# Patient Record
Sex: Male | Born: 1970 | Race: White | Hispanic: No | Marital: Married | Smoking: Former smoker
Health system: Southern US, Community
[De-identification: ages and names within clinical notes are randomized; demographics above are authoritative.]

## PROBLEM LIST (undated history)

## (undated) HISTORY — PX: BACK SURGERY: SHX140

## (undated) HISTORY — PX: OTHER SURGICAL HISTORY: SHX169

---

## 2002-05-22 ENCOUNTER — Ambulatory Visit (HOSPITAL_COMMUNITY): Admission: RE | Admit: 2002-05-22 | Discharge: 2002-05-22 | Payer: Self-pay | Admitting: Orthopaedic Surgery

## 2002-05-22 ENCOUNTER — Encounter: Payer: Self-pay | Admitting: Orthopaedic Surgery

## 2005-08-28 ENCOUNTER — Emergency Department (HOSPITAL_COMMUNITY): Admission: EM | Admit: 2005-08-28 | Discharge: 2005-08-28 | Payer: Self-pay | Admitting: Emergency Medicine

## 2005-09-20 ENCOUNTER — Ambulatory Visit (HOSPITAL_COMMUNITY): Admission: RE | Admit: 2005-09-20 | Discharge: 2005-09-20 | Payer: Self-pay | Admitting: Orthopaedic Surgery

## 2005-11-01 ENCOUNTER — Ambulatory Visit (HOSPITAL_COMMUNITY): Admission: RE | Admit: 2005-11-01 | Discharge: 2005-11-02 | Payer: Self-pay | Admitting: Orthopedic Surgery

## 2005-11-01 ENCOUNTER — Encounter (INDEPENDENT_AMBULATORY_CARE_PROVIDER_SITE_OTHER): Payer: Self-pay | Admitting: *Deleted

## 2006-10-28 IMAGING — CR DG LUMBAR SPINE COMPLETE 4+V
5 series · 5 of 5 positions shown · non-contrast
Comparison: none

HISTORY: Low back pain

LUMBAR SPINE 4 VIEWS:
Five lumbar vertebra.
Mild broad-based levoconvex scoliosis apex L3.
No fracture or subluxation.
Vertebral body and disc space heights maintained.
No spondylolysis or bone destruction.

[t l-spine a.p.]
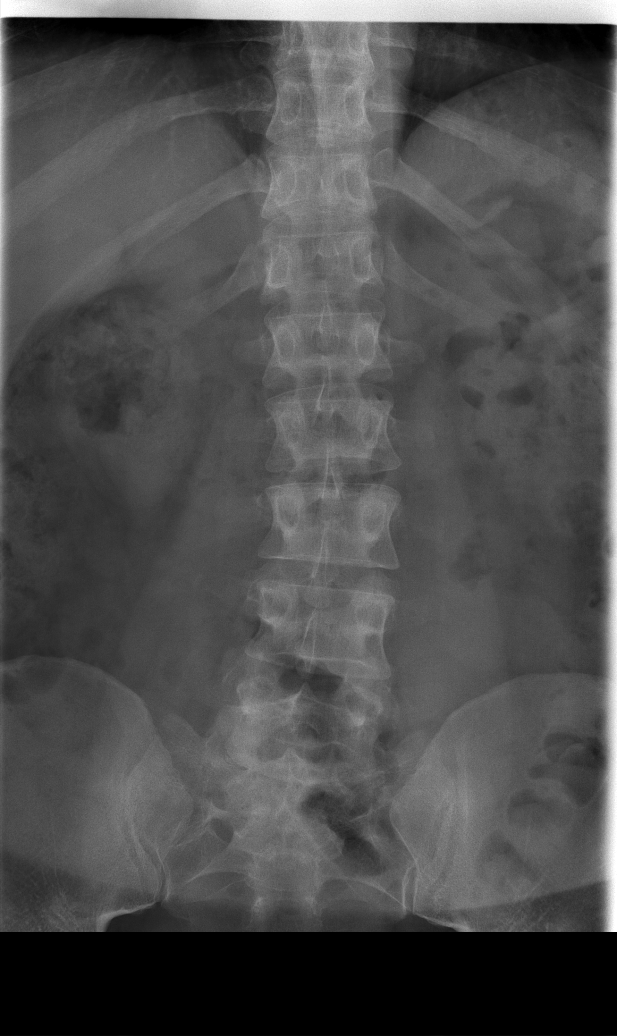

[t l-spine oblique exposure (1 of 2)]
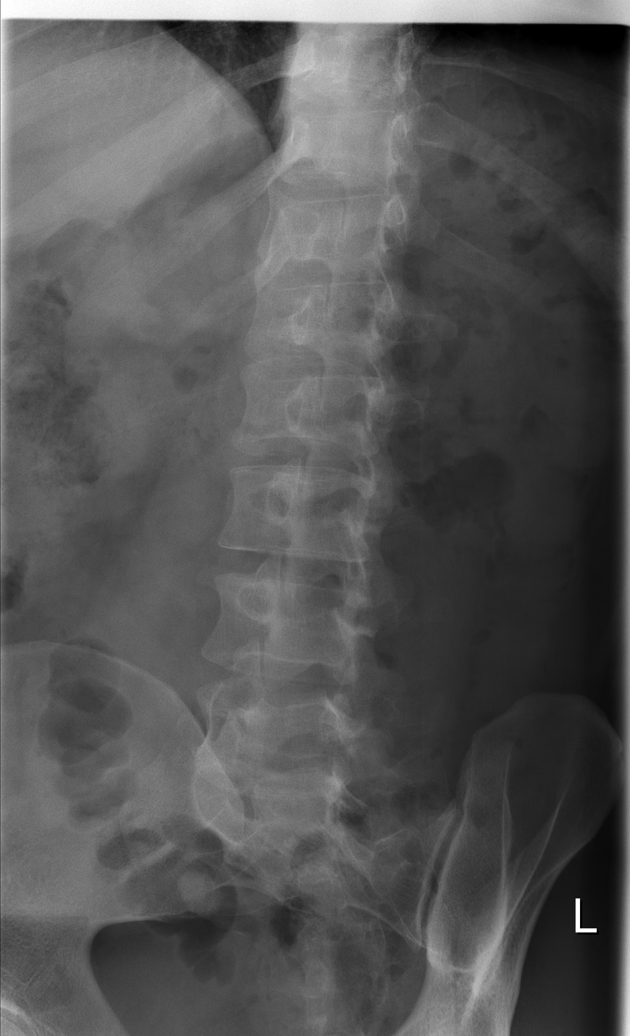

[t l-spine oblique exposure (2 of 2)]
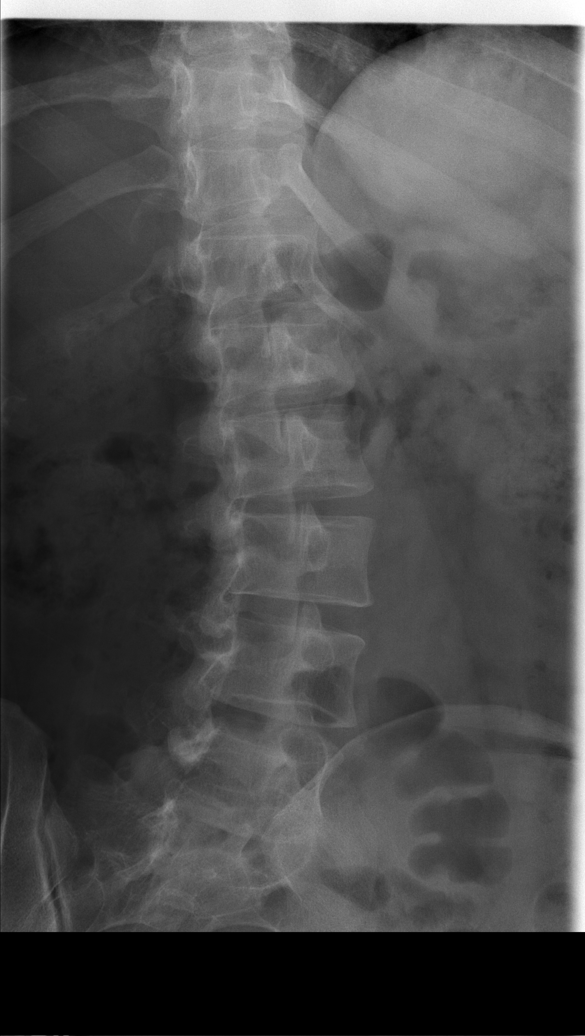

[t l-spine lat]
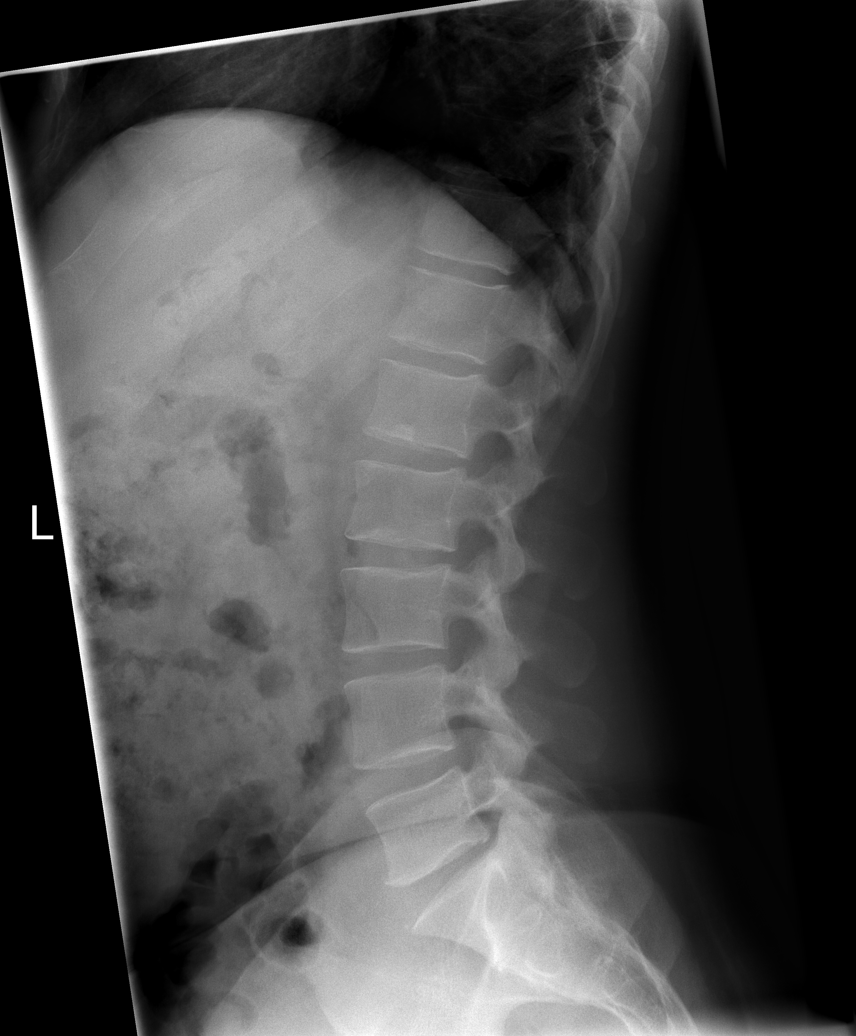

[t l-spine l5-s1 spot]
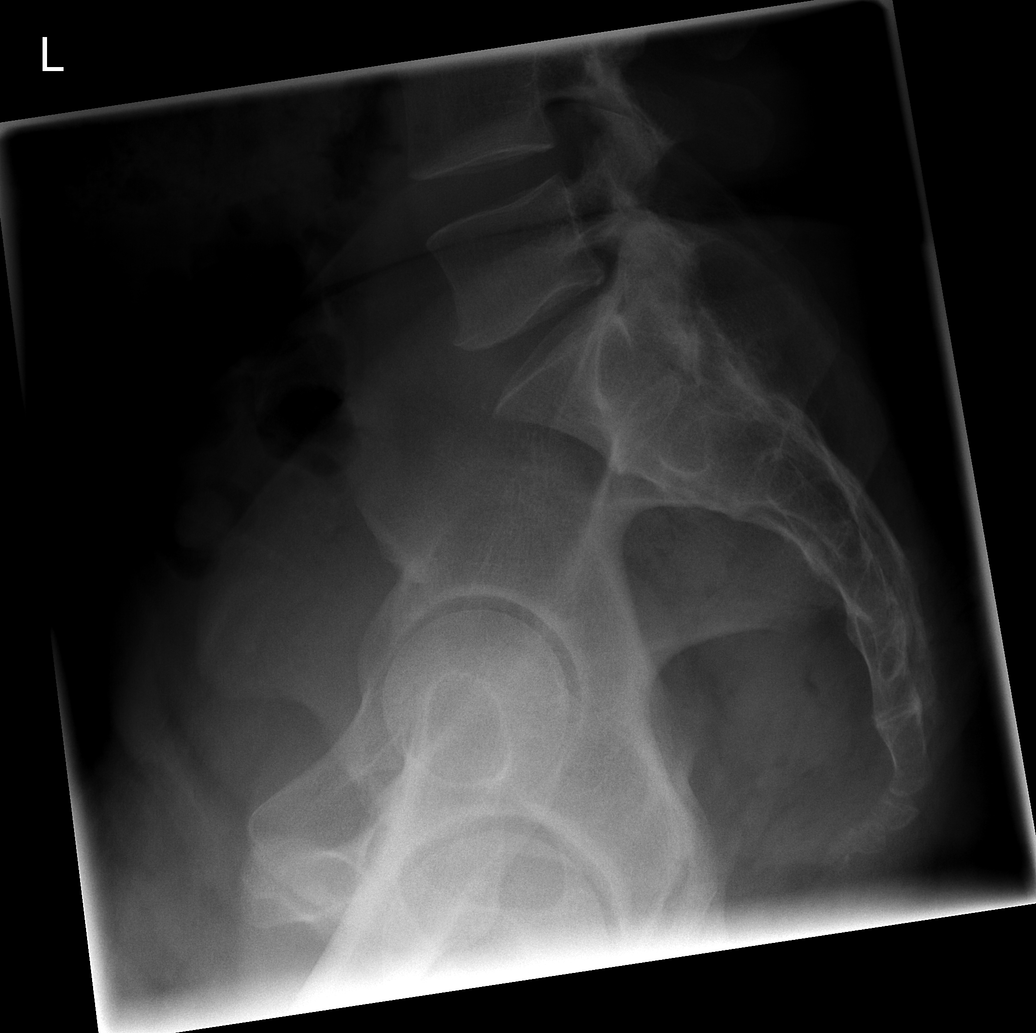

[5 of 5 positions shown; findings below may reference images not displayed]

IMPRESSION: Minimal scoliosis.

## 2009-07-29 ENCOUNTER — Ambulatory Visit: Payer: Self-pay | Admitting: Family Medicine

## 2009-07-29 DIAGNOSIS — R209 Unspecified disturbances of skin sensation: Secondary | ICD-10-CM | POA: Insufficient documentation

## 2009-07-29 DIAGNOSIS — M7710 Lateral epicondylitis, unspecified elbow: Secondary | ICD-10-CM | POA: Insufficient documentation

## 2009-08-11 ENCOUNTER — Encounter: Admission: RE | Admit: 2009-08-11 | Discharge: 2009-09-05 | Payer: Self-pay | Admitting: Family Medicine

## 2009-08-26 ENCOUNTER — Encounter: Payer: Self-pay | Admitting: Family Medicine

## 2009-09-24 ENCOUNTER — Encounter: Payer: Self-pay | Admitting: Family Medicine

## 2010-10-10 NOTE — Letter (Signed)
Summary: PHYSICAL THERAPY NOTE  PHYSICAL THERAPY NOTE   Imported By: Junius Finner 09/24/2009 11:40:27  _____________________________________________________________________  External Attachment:    Type:   Image     Comment:   External Document

## 2010-10-10 NOTE — Letter (Signed)
Summary: PHYSICAL THERAPY DISCHARGE SUMMARY  PHYSICAL THERAPY DISCHARGE SUMMARY   Imported By: Junius Finner 09/24/2009 11:39:08  _____________________________________________________________________  External Attachment:    Type:   Image     Comment:   External Document

## 2011-01-26 NOTE — Op Note (Signed)
NAME:  Michael Anthony, Michael Anthony                ACCOUNT NO.:  [id number]   MEDICAL RECORD NO.:  [id number]          PATIENT TYPE:  OIB   LOCATION:  1509                         FACILITY:  [facility]   PHYSICIAN:  Marlowe Kays, M.D.  DATE OF BIRTH:  [birth year]   DATE OF PROCEDURE:  11/01/2005  DATE OF DISCHARGE:  11/02/2005                                 OPERATIVE REPORT   PREOPERATIVE DIAGNOSIS:  Recurrent herniated nucleus pulposus, L5-S1, right.   POSTOPERATIVE DIAGNOSIS:  Recurrent herniated nucleus pulposus, L5-S1,  right.   OPERATION:  Microdiskectomy, L5-S1, right, with excision of recurrent  herniated nucleus pulposus.   SURGEON:  Marlowe Kays, M.D.   ASSISTANT:  Jene Every, M.D.   ANESTHESIA:  General.   PATHOLOGY AND JUSTIFICATION FOR PROCEDURE:  I originally performed his  surgery over 10 years ago.  Recently, he has had recurrence of right leg  pain with numbness in his little and fourth toes with an MRI showing a  recurrent disk herniation; accordingly, he is here today for the above-  mentioned surgery.  Risks and complications were discussed with him.   PROCEDURE:  Prophylactic antibiotic followed by general anesthesia, knee-  chest position on the Chisholm frame.  Back was prepped with DuraPrep, draped  into a sterile field, Ioban employed.  Old surgical scar was excised.  I  dissected soft tissue off the portions of the lamina of what appeared to be  L5 and S1, which I tagged with Kocher clamps, took a lateral x-ray,  confirming that we were at L5-S1, with the interspace midway between.  On  further dissection, it was apparent that this was the previous level of the  laminectomy.  I isolated the bony perimeter with small curette of the sacrum  and L5.  I was able to enter in above the sacrum with a 2-mm Kerrison  rongeur after curetting a small perforation there and I then began removing  bone, working around the perimeter, laterally and then superiorly, until we  had  some adequate working room.  We then brought in the microscope and  completed the perimeter dissection.  There was a synovial cyst at L5-S1 from  the facet joint, which we excised.  The S1 nerve root was identified and  retracted medially and freed up.  We kept dissecting cephalad until we found  the pedicle and the disk just above, which I identified with a spinal  needle.  I entered the interspace with a Penfield 4 instrument, enlarged  with Epstein curette and then began working with first a micro and then  small regular pituitaries and upbiting pituitaries, removing a large amount  of disk material, all that was available in the interspace.  When we felt  like the decompression had been completed, we checked and there was  excellent mobility of S1 nerve root and the dura.  The L5-S1 foramen was  widely patent.  Two potential bleeders were coagulated with bipolar cautery.  The wound was dry on closure.  I irrigated it well with sterile saline and  we then placed Gelfoam over the interspace, around the  S1 nerve root and  dura.  The self-retaining retractors were removed and again, there was no  unusual bleeding.  He was given 30 mg of Toradol IV.  I closed the fascia  with interrupted #1 Vicryl as well as the deep subcutaneous tissue, which I  then infiltrated with 0.5% plain Marcaine.  I completed the closure with 2-0  Vicryl in  the superficial subcutaneous tissue and staples in the skin.  Betadine,  Adaptic and dry sterile dressing were applied.  He tolerated the procedure  well and was taken to the recovery room in satisfactory condition with no  known complications.           ______________________________  Marlowe Kays, M.D.     JA/MEDQ  D:  11/01/2005  T:  11/02/2005  Job:  [id number]

## 2017-10-14 ENCOUNTER — Emergency Department (INDEPENDENT_AMBULATORY_CARE_PROVIDER_SITE_OTHER)
Admission: EM | Admit: 2017-10-14 | Discharge: 2017-10-14 | Disposition: A | Discharge status: Home / Self Care | Payer: Commercial Managed Care - PPO | Source: Home / Self Care | Attending: Family Medicine | Admitting: Family Medicine

## 2017-10-14 ENCOUNTER — Encounter: Payer: Self-pay | Admitting: Emergency Medicine

## 2017-10-14 DIAGNOSIS — J3489 Other specified disorders of nose and nasal sinuses: Secondary | ICD-10-CM

## 2017-10-14 DIAGNOSIS — J029 Acute pharyngitis, unspecified: Secondary | ICD-10-CM | POA: Diagnosis not present

## 2017-10-14 DIAGNOSIS — J111 Influenza due to unidentified influenza virus with other respiratory manifestations: Secondary | ICD-10-CM

## 2017-10-14 DIAGNOSIS — R51 Headache: Secondary | ICD-10-CM | POA: Diagnosis not present

## 2017-10-14 DIAGNOSIS — R0981 Nasal congestion: Secondary | ICD-10-CM | POA: Diagnosis not present

## 2017-10-14 DIAGNOSIS — R69 Illness, unspecified: Principal | ICD-10-CM

## 2017-10-14 DIAGNOSIS — R05 Cough: Secondary | ICD-10-CM | POA: Diagnosis not present

## 2017-10-14 DIAGNOSIS — Z20828 Contact with and (suspected) exposure to other viral communicable diseases: Secondary | ICD-10-CM

## 2017-10-14 LAB — POCT RAPID STREP A (OFFICE): Rapid Strep A Screen: NEGATIVE

## 2017-10-14 MED ORDER — ONDANSETRON HCL 4 MG PO TABS: 4.0000 mg | ORAL_TABLET | Freq: Four times a day (QID) | ORAL | 0 refills | Status: DC

## 2017-10-14 MED ORDER — BENZONATATE 100 MG PO CAPS: 100.0000 mg | ORAL_CAPSULE | Freq: Three times a day (TID) | ORAL | 0 refills | Status: DC

## 2017-10-14 MED ORDER — OSELTAMIVIR PHOSPHATE 75 MG PO CAPS: 75.0000 mg | ORAL_CAPSULE | Freq: Two times a day (BID) | ORAL | 0 refills | Status: DC

## 2017-10-14 NOTE — ED Triage Notes (Signed)
Pt states Thursday he developed fever and body aches. Denies fever since then but still having cough and HA. States co workers have been dx with flu A.

## 2017-10-14 NOTE — Discharge Instructions (Signed)
°  You may take 500mg  acetaminophen every 4-6 hours or in combination with ibuprofen 400-600mg  every 6-8 hours as needed for pain, inflammation, and fever.  Be sure to drink at least eight 8oz glasses of water to stay well hydrated and get at least 8 hours of sleep at night, preferably more while sick.   Oseltamivir (Tamiflu) works best if started within the first 48 hours of symptom onset but you may still get some benefit from taking ,especially if others around you still have the flu.  This medication may cause stomach upset including nausea, vomiting and diarrhea.  To help prevent stomach upset, you may take this medication with food.  If you are still having unwanted symptoms, you may stop taking this medication as it is not as important to finish the entire course like antibiotics.  If you have questions/concerns please call our office or follow up with your primary care provider.

## 2017-10-14 NOTE — ED Provider Notes (Signed)
[facility]    CSN: [id number] Arrival date & time: 10/14/17  1045     History   Chief Complaint Chief Complaint  Patient presents with  . Fever    HPI Michael Anthony is a 47 y.o. male.   HPI Michael Anthony is a 47 y.o. male presenting to UC with c/o 2 days of fever of 101*F with body aches that started 4 days ago.  Fever has resolved but he has had continued cough, sore throat and generalized HA.  He has taken Dayquil and Nyquil with moderate relief. Several co-workers have been dx with influenza A.  He did not receive the flu vaccine this season. Mild nausea but no vomiting or diarrhea. Denies chest pain or SOB.   History reviewed. No pertinent past medical history.  Patient Active Problem List   Diagnosis Date Noted  . LATERAL EPICONDYLITIS, RIGHT 07/29/2009  . PARESTHESIA, HANDS 07/29/2009    History reviewed. No pertinent surgical history.     Home Medications    Prior to Admission medications   Medication Sig Start Date End Date Taking? Authorizing Provider  benzonatate (TESSALON) 100 MG capsule Take 1-2 capsules (100-200 mg total) by mouth every 8 (eight) hours. 10/14/17   Lurene Shadow, PA-C  ondansetron (ZOFRAN) 4 MG tablet Take 1 tablet (4 mg total) by mouth every 6 (six) hours. 10/14/17   Lurene Shadow, PA-C  oseltamivir (TAMIFLU) 75 MG capsule Take 1 capsule (75 mg total) by mouth every 12 (twelve) hours. 10/14/17   Lurene Shadow, PA-C    Family History History reviewed. No pertinent family history.  Social History Social History   Tobacco Use  . Smoking status: Current Every Day Smoker    Types: Cigarettes, E-cigarettes  . Smokeless tobacco: Never Used  Substance Use Topics  . Alcohol use: Yes  . Drug use: Not on file     Allergies   Sulfonamide derivatives and Penicillins   Review of Systems Review of Systems  Constitutional: Negative for chills and fever.  HENT: Positive for congestion, rhinorrhea and sore throat. Negative for  ear pain, trouble swallowing and voice change.   Respiratory: Positive for cough. Negative for shortness of breath.   Cardiovascular: Negative for chest pain and palpitations.  Gastrointestinal: Negative for abdominal pain, diarrhea, nausea and vomiting.  Musculoskeletal: Negative for arthralgias, back pain and myalgias.  Skin: Negative for rash.  Neurological: Positive for headaches. Negative for dizziness and light-headedness.     Physical Exam Triage Vital Signs ED Triage Vitals [10/14/17 1126]  Enc Vitals Group     BP 112/78     Pulse Rate 76     Resp      Temp 98 F (36.7 C)     Temp Source Oral     SpO2 97 %     Weight 194 lb (88 kg)     Height      Head Circumference      Peak Flow      Pain Score 0     Pain Loc      Pain Edu?      Excl. in GC?    No data found.  Updated Vital Signs BP 112/78 (BP Location: Right Arm)   Pulse 76   Temp 98 F (36.7 C) (Oral)   Wt 194 lb (88 kg)   SpO2 97%   BMI 32.28 kg/m   Visual Acuity Right Eye Distance:   Left Eye Distance:   Bilateral Distance:  Right Eye Near:   Left Eye Near:    Bilateral Near:     Physical Exam  Constitutional: He is oriented to person, place, and time. He appears well-developed and well-nourished. No distress.  HENT:  Head: Normocephalic and atraumatic.  Right Ear: Tympanic membrane normal.  Left Ear: Tympanic membrane normal.  Nose: Right sinus exhibits no maxillary sinus tenderness and no frontal sinus tenderness. Left sinus exhibits no maxillary sinus tenderness and no frontal sinus tenderness.  Mouth/Throat: Uvula is midline and mucous membranes are normal. Posterior oropharyngeal erythema present. No oropharyngeal exudate, posterior oropharyngeal edema or tonsillar abscesses.  Eyes: EOM are normal.  Neck: Normal range of motion. Neck supple.  Cardiovascular: Normal rate and regular rhythm.  Pulmonary/Chest: Effort normal and breath sounds normal. No stridor. No respiratory distress.  He has no wheezes. He has no rales.  Musculoskeletal: Normal range of motion.  Lymphadenopathy:    He has no cervical adenopathy.  Neurological: He is alert and oriented to person, place, and time.  Skin: Skin is warm and dry. He is not diaphoretic.  Psychiatric: He has a normal mood and affect. His behavior is normal.  Nursing note and vitals reviewed.    UC Treatments / Results  Labs (all labs ordered are listed, but only abnormal results are displayed) Labs Reviewed  POCT RAPID STREP A (OFFICE)    EKG  EKG Interpretation None       Radiology No results found.  Procedures Procedures (including critical care time)  Medications Ordered in UC Medications - No data to display   Initial Impression / Assessment and Plan / UC Course  I have reviewed the triage vital signs and the nursing notes.  Pertinent labs & imaging results that were available during my care of the patient were reviewed by me and considered in my medical decision making (see chart for details).     Strep: NEGATIVE  Advised pt he likely has/had the flu due to not getting the flu vaccine as well as known exposure at work. Advised pt Tamiflu is recommended to be started within first 48 hours of symptom onset. Pt still wants to try Tamiflu Medication sent to pharmacy Home care instructions provided F/u with PCP in 1 week if needed.   Final Clinical Impressions(s) / UC Diagnoses   Final diagnoses:  Influenza-like illness  Exposure to the flu  Acute pharyngitis, unspecified etiology    ED Discharge Orders        Ordered    oseltamivir (TAMIFLU) 75 MG capsule  Every 12 hours     10/14/17 1203    benzonatate (TESSALON) 100 MG capsule  Every 8 hours     10/14/17 1203    ondansetron (ZOFRAN) 4 MG tablet  Every 6 hours     10/14/17 1204       Controlled Substance Prescriptions [place] Controlled Substance Registry consulted? Not Applicable   Rolla Platehelps, Jaedan Huttner O, PA-C 10/14/17 1238

## 2021-03-25 ENCOUNTER — Emergency Department (INDEPENDENT_AMBULATORY_CARE_PROVIDER_SITE_OTHER)
Admission: EM | Admit: 2021-03-25 | Discharge: 2021-03-25 | Disposition: A | Discharge status: Home / Self Care | Payer: Managed Care, Other (non HMO) | Source: Home / Self Care

## 2021-03-25 ENCOUNTER — Other Ambulatory Visit: Payer: Self-pay

## 2021-03-25 DIAGNOSIS — K029 Dental caries, unspecified: Secondary | ICD-10-CM

## 2021-03-25 MED ORDER — CLINDAMYCIN HCL 300 MG PO CAPS: 300.0000 mg | ORAL_CAPSULE | Freq: Three times a day (TID) | ORAL | 0 refills | Status: AC

## 2021-03-25 NOTE — ED Provider Notes (Signed)
[facility]    CSN: [id number] Arrival date & time: 03/25/21  1522      History   Chief Complaint Chief Complaint  Patient presents with   Dental Problem    HPI Michael Anthony is a 50 y.o. male.   HPI 50 year old male presents with dental pain and swelling of right lower tooth.  Reports that he is supposed to have it pulled and 1 week; however, pain and swelling adjacent to tooth has increased over the past day.  History reviewed. No pertinent past medical history.  Patient Active Problem List   Diagnosis Date Noted   LATERAL EPICONDYLITIS, RIGHT 07/29/2009   PARESTHESIA, HANDS 07/29/2009    Past Surgical History:  Procedure Laterality Date   BACK SURGERY         Home Medications    Prior to Admission medications   Medication Sig Start Date End Date Taking? Authorizing Provider  clindamycin (CLEOCIN) 300 MG capsule Take 1 capsule (300 mg total) by mouth 3 (three) times daily for 7 days. 03/25/21 04/01/21 Yes Trevor Iha, FNP  naproxen sodium (ALEVE) 220 MG tablet Take 220 mg by mouth 2 (two) times daily as needed.   Yes [provider]  benzonatate (TESSALON) 100 MG capsule Take 1-2 capsules (100-200 mg total) by mouth every 8 (eight) hours. 10/14/17   Lurene Shadow, PA-C  ondansetron (ZOFRAN) 4 MG tablet Take 1 tablet (4 mg total) by mouth every 6 (six) hours. 10/14/17   Lurene Shadow, PA-C  oseltamivir (TAMIFLU) 75 MG capsule Take 1 capsule (75 mg total) by mouth every 12 (twelve) hours. 10/14/17   Lurene Shadow, PA-C    Family History Family History  Problem Relation Age of Onset   COPD Mother    Cancer Father     Social History Social History   Tobacco Use   Smoking status: Former    Types: Cigarettes    Quit date: 09/11/2017    Years since quitting: 3.5   Smokeless tobacco: Never  Vaping Use   Vaping Use: Every day  Substance Use Topics   Alcohol use: Yes    Comment: occasionally     Allergies   Ibuprofen,  Sulfonamide derivatives, and Penicillins   Review of Systems Review of Systems  HENT:  Positive for dental problem.   All other systems reviewed and are negative.   Physical Exam Triage Vital Signs ED Triage Vitals  Enc Vitals Group     BP 03/25/21 1544 111/77     Pulse Rate 03/25/21 1544 72     Resp 03/25/21 1544 18     Temp 03/25/21 1544 99.1 F (37.3 C)     Temp src --      SpO2 03/25/21 1544 98 %     Weight 03/25/21 1536 190 lb (86.2 kg)     Height 03/25/21 1536 5\' 8"  (1.727 m)     Head Circumference --      Peak Flow --      Pain Score 03/25/21 1535 3     Pain Loc --      Pain Edu? --      Excl. in GC? --    No data found.  Updated Vital Signs BP 111/77   Pulse 72   Temp 99.1 F (37.3 C)   Resp 18   Ht 5\' 8"  (1.727 m)   Wt 190 lb (86.2 kg)   SpO2 98%   BMI 28.89 kg/m  Physical Exam Vitals and nursing note reviewed.  Constitutional:      General: He is not in acute distress.    Appearance: Normal appearance. He is normal weight. He is not ill-appearing.  HENT:     Head: Normocephalic and atraumatic.     Mouth/Throat:     Mouth: Mucous membranes are moist.     Pharynx: Oropharynx is clear.     Comments: Left lower: central incisors, canine, second premolar-significant dental caries noted with a moderate erythematous medial and lateral gingival borders. Eyes:     Extraocular Movements: Extraocular movements intact.     Conjunctiva/sclera: Conjunctivae normal.     Pupils: Pupils are equal, round, and reactive to light.  Cardiovascular:     Rate and Rhythm: Normal rate and regular rhythm.     Pulses: Normal pulses.     Heart sounds: Normal heart sounds.  Pulmonary:     Effort: Pulmonary effort is normal.     Breath sounds: Normal breath sounds.     Comments: No adventitious breath sounds noted Musculoskeletal:        General: Normal range of motion.     Cervical back: Normal range of motion and neck supple.  Skin:    General: Skin is warm and  dry.  Neurological:     General: No focal deficit present.     Mental Status: He is alert and oriented to person, place, and time.  Psychiatric:        Mood and Affect: Mood normal.        Behavior: Behavior normal.     UC Treatments / Results  Labs (all labs ordered are listed, but only abnormal results are displayed) Labs Reviewed - No data to display  EKG   Radiology No results found.  Procedures Procedures (including critical care time)  Medications Ordered in UC Medications - No data to display  Initial Impression / Assessment and Plan / UC Course  I have reviewed the triage vital signs and the nursing notes.  Pertinent labs & imaging results that were available during my care of the patient were reviewed by me and considered in my medical decision making (see chart for details).    MDM: 1.  Pain due to dental caries-Rx'd Clindamycin, advised/instructed patient to follow-up with his dentist as soon as possible next week.  Patient discharged home, hemodynamically stable Final Clinical Impressions(s) / UC Diagnoses   Final diagnoses:  Pain due to dental caries     Discharge Instructions      Advised/instructed patient to take medication as directed with food to completion.  Advised/instructed patient to follow-up with dentist for immediate evaluation as soon as possible.     ED Prescriptions     Medication Sig Dispense Auth. Provider   clindamycin (CLEOCIN) 300 MG capsule Take 1 capsule (300 mg total) by mouth 3 (three) times daily for 7 days. 21 capsule Trevor Iha, FNP      PDMP not reviewed this encounter.   Trevor Iha, FNP 03/25/21 1615

## 2021-03-25 NOTE — Discharge Instructions (Addendum)
Advised/instructed patient to take medication as directed with food to completion.  Advised/instructed patient to follow-up with dentist for immediate evaluation as soon as possible.

## 2021-03-25 NOTE — ED Triage Notes (Signed)
Pt presents to Urgent Care with c/o dental pain and swelling--R lower tooth. States he is supposed to have it pulled in one week, but the pain and swelling has intensified over the past day.

## 2021-09-26 ENCOUNTER — Emergency Department (INDEPENDENT_AMBULATORY_CARE_PROVIDER_SITE_OTHER)
Admission: RE | Admit: 2021-09-26 | Discharge: 2021-09-26 | Disposition: A | Discharge status: Home / Self Care | Payer: BC Managed Care – PPO | Source: Ambulatory Visit | Attending: Family Medicine | Admitting: Family Medicine

## 2021-09-26 ENCOUNTER — Other Ambulatory Visit: Payer: Self-pay

## 2021-09-26 VITALS — BP 141/83 | HR 69 | Temp 98.6°F | Resp 16 | Ht 68.0 in | Wt 195.0 lb

## 2021-09-26 DIAGNOSIS — U071 COVID-19: Secondary | ICD-10-CM

## 2021-09-26 DIAGNOSIS — J029 Acute pharyngitis, unspecified: Secondary | ICD-10-CM

## 2021-09-26 LAB — POC SARS CORONAVIRUS 2 AG -  ED: SARS Coronavirus 2 Ag: POSITIVE — AB

## 2021-09-26 LAB — POCT RAPID STREP A (OFFICE): Rapid Strep A Screen: NEGATIVE

## 2021-09-26 MED ORDER — PAXLOVID (300/100) 20 X 150 MG & 10 X 100MG PO TBPK: ORAL_TABLET | ORAL | 0 refills | Status: AC

## 2021-09-26 NOTE — ED Triage Notes (Signed)
Sore throat, cough,  runny nose & fever since Sunday morning Temp 101 at home on Sunday  OTC meds - nyquil & dayquil ( last dose at 1345) Son was sick with same symptoms 1 week ago  COVID positive 08/25/20

## 2021-09-26 NOTE — Discharge Instructions (Addendum)
Drink lots of fluids Take over-the-counter medicine for cold and flu symptoms Take paxlovid 2 times a day for 5 days You must quarantine for 5 days at home After this wear mask to prevent transmission

## 2021-09-26 NOTE — ED Provider Notes (Signed)
[name]    CSN: [id number] Arrival date & time: 09/26/21  1651      History   Chief Complaint Chief Complaint  Patient presents with   Nasal Congestion   Fever    HPI Michael Anthony is a 51 y.o. male.   HPI  Patient states has had nasal congestion fever and sore throat for the last couple of days.  His son was sick last week.  He states his throat is pretty painful.  He is worried about strep throat.  He also has had fever.  No headache or body aches.  No fatigue.  No known exposure to illness  History reviewed. No pertinent past medical history.  Patient Active Problem List   Diagnosis Date Noted   LATERAL EPICONDYLITIS, RIGHT 07/29/2009   PARESTHESIA, HANDS 07/29/2009    Past Surgical History:  Procedure Laterality Date   BACK SURGERY         Home Medications    Prior to Admission medications   Medication Sig Start Date End Date Taking? Authorizing Provider  nirmatrelvir & ritonavir (PAXLOVID, 300/100,) 20 x 150 MG & 10 x 100MG  TBPK Take as directed 2 x a day 09/26/21  Yes 09/28/21, MD  naproxen sodium (ALEVE) 220 MG tablet Take 220 mg by mouth 2 (two) times daily as needed.    [provider]    Family History Family History  Problem Relation Age of Onset   COPD Mother    Cancer Father     Social History Social History   Tobacco Use   Smoking status: Former    Types: Cigarettes, E-cigarettes    Quit date: 09/11/2017    Years since quitting: 4.0   Smokeless tobacco: Never  Vaping Use   Vaping Use: Every day   Substances: Nicotine, Flavoring  Substance Use Topics   Alcohol use: Yes    Comment: occasionally     Allergies   Penicillins, Ibuprofen, and Sulfonamide derivatives   Review of Systems Review of Systems See HPI  Physical Exam Triage Vital Signs ED Triage Vitals  Enc Vitals Group     BP 09/26/21 1659 (!) 141/83     Pulse Rate 09/26/21 1659 69     Resp 09/26/21 1659 16     Temp 09/26/21 1659  98.6 F (37 C)     Temp Source 09/26/21 1659 Oral     SpO2 09/26/21 1659 98 %     Weight 09/26/21 1704 195 lb (88.5 kg)     Height 09/26/21 1704 5\' 8"  (1.727 m)     Head Circumference --      Peak Flow --      Pain Score 09/26/21 1703 4     Pain Loc --      Pain Edu? --      Excl. in GC? --    No data found.  Updated Vital Signs BP (!) 141/83 (BP Location: Left Arm)    Pulse 69    Temp 98.6 F (37 C) (Oral)    Resp 16    Ht 5\' 8"  (1.727 m)    Wt 88.5 kg    SpO2 98%    BMI 29.65 kg/m      Physical Exam Constitutional:      General: He is not in acute distress.    Appearance: Normal appearance. He is well-developed.  HENT:     Head: Normocephalic and atraumatic.     Mouth/Throat:     Comments:  Mask is in place Eyes:     Conjunctiva/sclera: Conjunctivae normal.     Pupils: Pupils are equal, round, and reactive to light.  Cardiovascular:     Rate and Rhythm: Normal rate.  Pulmonary:     Effort: Pulmonary effort is normal. No respiratory distress.  Abdominal:     General: There is no distension.     Palpations: Abdomen is soft.  Musculoskeletal:        General: Normal range of motion.     Cervical back: Normal range of motion.  Skin:    General: Skin is warm and dry.  Neurological:     Mental Status: He is alert.  Psychiatric:        Mood and Affect: Mood normal.        Behavior: Behavior normal.     UC Treatments / Results  Labs (all labs ordered are listed, but only abnormal results are displayed) Labs Reviewed  POC SARS CORONAVIRUS 2 AG -  ED - Abnormal; Notable for the following components:      Result Value   SARS Coronavirus 2 Ag Positive (*)    All other components within normal limits  CULTURE, GROUP A STREP  POCT RAPID STREP A (OFFICE)    EKG   Radiology No results found.  Procedures Procedures (including critical care time)  Medications Ordered in UC Medications - No data to display  Initial Impression / Assessment and Plan / UC Course   I have reviewed the triage vital signs and the nursing notes.  Pertinent labs & imaging results that were available during my care of the patient were reviewed by me and considered in my medical decision making (see chart for details).     Patient has COVID.  I discussed the antiviral therapy.  He is only 50 and does not have any major medical problems, he has mild symptoms so I do not guarantee that we will give him much improvement but he does desire paxlovid therapy.  I reviewed his chart and found his last creatinine 1.93.  He does not have any medical problems or medications that would reduce his creatinine. Final Clinical Impressions(s) / UC Diagnoses   Final diagnoses:  Acute pharyngitis, unspecified etiology  COVID-19     Discharge Instructions      Drink lots of fluids Take over-the-counter medicine for cold and flu symptoms Take paxlovid 2 times a day for 5 days You must quarantine for 5 days at home After this wear mask to prevent transmission   ED Prescriptions     Medication Sig Dispense Auth. Provider   nirmatrelvir & ritonavir (PAXLOVID, 300/100,) 20 x 150 MG & 10 x 100MG  TBPK Take as directed 2 x a day 1 each , MD      PDMP not reviewed this encounter.   Eustace Moore, MD 09/26/21 [phone]

## 2021-09-29 LAB — CULTURE, GROUP A STREP: Strep A Culture: NEGATIVE

## 2022-06-25 ENCOUNTER — Ambulatory Visit
Admission: EM | Admit: 2022-06-25 | Discharge: 2022-06-25 | Disposition: A | Discharge status: Home / Self Care | Payer: BC Managed Care – PPO | Attending: Family Medicine | Admitting: Family Medicine

## 2022-06-25 DIAGNOSIS — H6692 Otitis media, unspecified, left ear: Secondary | ICD-10-CM | POA: Diagnosis not present

## 2022-06-25 MED ORDER — CEFDINIR 300 MG PO CAPS: 300.0000 mg | ORAL_CAPSULE | Freq: Two times a day (BID) | ORAL | 0 refills | Status: AC

## 2022-06-25 NOTE — ED Provider Notes (Signed)
[facility]    CSN: [id number] Arrival date & time: 06/25/22  1022      History   Chief Complaint Chief Complaint  Patient presents with   Ear Fullness   Otalgia   Hearing Problem    HPI Michael Anthony is a 51 y.o. male.   HPI Pleasant 51 year old male presents with left ear fullness for 1.5 weeks.  Reports using OTC eardrops as needed due to history of wax buildup.  Reports hearing difficulty and ear pain when using this OTC drop.  PMH significant for obesity and paresthesia of the hands.  History reviewed. No pertinent past medical history.  Patient Active Problem List   Diagnosis Date Noted   LATERAL EPICONDYLITIS, RIGHT 07/29/2009   PARESTHESIA, HANDS 07/29/2009    Past Surgical History:  Procedure Laterality Date   BACK SURGERY         Home Medications    Prior to Admission medications   Medication Sig Start Date End Date Taking? Authorizing Provider  cefdinir (OMNICEF) 300 MG capsule Take 1 capsule (300 mg total) by mouth 2 (two) times daily for 10 days. 06/25/22 07/05/22 Yes Trevor Iha, FNP  naproxen sodium (ALEVE) 220 MG tablet Take 220 mg by mouth 2 (two) times daily as needed.    [provider]  nirmatrelvir & ritonavir (PAXLOVID, 300/100,) 20 x 150 MG & 10 x 100MG  TBPK Take as directed 2 x a day 09/26/21   09/28/21, MD    Family History Family History  Problem Relation Age of Onset   COPD Mother    Cancer Father     Social History Social History   Tobacco Use   Smoking status: Former    Types: Cigarettes    Quit date: 09/11/2017    Years since quitting: 4.7   Smokeless tobacco: Never  Vaping Use   Vaping Use: Every day   Substances: Nicotine, Flavoring  Substance Use Topics   Alcohol use: Yes    Comment: occasionally   Drug use: Not Currently     Allergies   Penicillins, Ibuprofen, and Sulfonamide derivatives   Review of Systems Review of Systems  HENT:  Positive for congestion and ear pain.    All other systems reviewed and are negative.    Physical Exam Triage Vital Signs ED Triage Vitals  Enc Vitals Group     BP 06/25/22 1120 108/74     Pulse Rate 06/25/22 1120 63     Resp 06/25/22 1120 20     Temp 06/25/22 1120 97.9 F (36.6 C)     Temp Source 06/25/22 1120 Oral     SpO2 06/25/22 1120 97 %     Weight 06/25/22 1118 195 lb (88.5 kg)     Height 06/25/22 1118 5\' 8"  (1.727 m)     Head Circumference --      Peak Flow --      Pain Score 06/25/22 1117 2     Pain Loc --      Pain Edu? --      Excl. in GC? --    No data found.  Updated Vital Signs BP 108/74 (BP Location: Right Arm)   Pulse 63   Temp 97.9 F (36.6 C) (Oral)   Resp 20   Ht 5\' 8"  (1.727 m)   Wt 195 lb (88.5 kg)   SpO2 97%   BMI 29.65 kg/m    Physical Exam Vitals and nursing note reviewed.  Constitutional:  General: He is not in acute distress.    Appearance: Normal appearance. He is normal weight. He is not ill-appearing.  HENT:     Head: Normocephalic and atraumatic.     Right Ear: Tympanic membrane and external ear normal.     Left Ear: External ear normal.     Ears:     Comments: Left TM: Erythematous, bulging, mild cerumen accumulation noted of right EAC    Mouth/Throat:     Mouth: Mucous membranes are moist.     Pharynx: Oropharynx is clear.  Eyes:     Extraocular Movements: Extraocular movements intact.     Conjunctiva/sclera: Conjunctivae normal.     Pupils: Pupils are equal, round, and reactive to light.  Cardiovascular:     Rate and Rhythm: Normal rate and regular rhythm.     Pulses: Normal pulses.     Heart sounds: Normal heart sounds.  Pulmonary:     Effort: Pulmonary effort is normal.     Breath sounds: Normal breath sounds. No wheezing, rhonchi or rales.  Musculoskeletal:        General: Normal range of motion.     Cervical back: Normal range of motion and neck supple. No tenderness.  Lymphadenopathy:     Cervical: No cervical adenopathy.  Skin:    General:  Skin is warm and dry.  Neurological:     General: No focal deficit present.     Mental Status: He is alert and oriented to person, place, and time. Mental status is at baseline.      UC Treatments / Results  Labs (all labs ordered are listed, but only abnormal results are displayed) Labs Reviewed - No data to display  EKG   Radiology No results found.  Procedures Procedures (including critical care time)  Medications Ordered in UC Medications - No data to display  Initial Impression / Assessment and Plan / UC Course  I have reviewed the triage vital signs and the nursing notes.  Pertinent labs & imaging results that were available during my care of the patient were reviewed by me and considered in my medical decision making (see chart for details).     MDM: 1.  Acute left otitis media-Rx'd Cefdinir. Advised patient to take medication as directed with food to completion.  Encouraged patient increase daily water intake while taking this medication.  Advised patient if symptoms worsen and/or unresolved please follow-up with [facility]NT for further evaluation.  [facility] ENT located at [address], [address], [place], [place] [address] [phone].  Patient discharged home, hemodynamically stable. Final Clinical Impressions(s) / UC Diagnoses   Final diagnoses:  Acute left otitis media     Discharge Instructions      Advised patient to take medication as directed with food to completion.  Encouraged patient increase daily water intake while taking this medication.  Advised patient if symptoms worsen and/or unresolved please follow-up with [facility] ENT for further evaluation.  [facility] ENT located at [address], [place], [place] [address] [phone].     ED Prescriptions     Medication Sig Dispense Auth. Provider   cefdinir (OMNICEF) 300 MG capsule Take 1 capsule (300 mg total) by mouth 2 (two) times daily for 10 days. 20 capsule Eliezer Lofts,  FNP      PDMP not reviewed this encounter.   Eliezer Lofts, [place] 06/25/22 1248

## 2022-06-25 NOTE — ED Triage Notes (Signed)
Pt presents to Urgent Care with c/o L ear fullness x approx 1.5 weeks. States he used OTC ear drops d/t hx of wax buildup, but he has had problems w/ hearing and ear pain since using this.

## 2022-06-25 NOTE — Discharge Instructions (Addendum)
Advised patient to take medication as directed with food to completion.  Encouraged patient increase daily water intake while taking this medication.  Advised patient if symptoms worsen and/or unresolved please follow-up with [facility] ENT for further evaluation.  [facility] ENT located at [address], [address], [place], [place] [address] [phone].

## 2022-06-26 ENCOUNTER — Telehealth: Payer: Self-pay

## 2022-06-26 NOTE — Telephone Encounter (Signed)
TCT pt to follow up from recent visit. HIPAA compliant VM left for return call.  

## 2023-05-11 ENCOUNTER — Ambulatory Visit
Admission: EM | Admit: 2023-05-11 | Discharge: 2023-05-11 | Disposition: A | Discharge status: Home / Self Care | Payer: BC Managed Care – PPO | Source: Home / Self Care | Attending: Family Medicine | Admitting: Family Medicine

## 2023-05-11 DIAGNOSIS — K047 Periapical abscess without sinus: Secondary | ICD-10-CM | POA: Diagnosis not present

## 2023-05-11 DIAGNOSIS — K029 Dental caries, unspecified: Secondary | ICD-10-CM

## 2023-05-11 MED ORDER — CEFDINIR 300 MG PO CAPS: 300.0000 mg | ORAL_CAPSULE | Freq: Two times a day (BID) | ORAL | 0 refills | Status: AC

## 2023-05-11 NOTE — ED Triage Notes (Signed)
Pt state that he has some dental pain. X2 days

## 2023-05-11 NOTE — Discharge Instructions (Signed)
Take cefdinir antibiotic 2 times a day Take over-the-counter medicine as needed for pain Call your dentist Monday to make an appointment for follow-up

## 2023-05-11 NOTE — ED Provider Notes (Signed)
[name]    CSN: [id number] Arrival date & time: 05/11/23  1445      History   Chief Complaint Chief Complaint  Patient presents with   Dental Pain    Dental pain x2 days    HPI Michael Anthony is a 52 y.o. male.   Patient states that he is under the care of a dentist trying to do more restorations of teeth that are damaged.  In the last couple days he has had redness swelling and increased pain in the left lower cuspid region.    History reviewed. No pertinent past medical history.  Patient Active Problem List   Diagnosis Date Noted   LATERAL EPICONDYLITIS, RIGHT 07/29/2009   PARESTHESIA, HANDS 07/29/2009    Past Surgical History:  Procedure Laterality Date   BACK SURGERY     hernia         Home Medications    Prior to Admission medications   Medication Sig Start Date End Date Taking? Authorizing Provider  cefdinir (OMNICEF) 300 MG capsule Take 1 capsule (300 mg total) by mouth 2 (two) times daily. 05/11/23  Yes Eustace Moore, MD  naproxen sodium (ALEVE) 220 MG tablet Take 220 mg by mouth 2 (two) times daily as needed.   Yes [provider]  nirmatrelvir & ritonavir (PAXLOVID, 300/100,) 20 x 150 MG & 10 x 100MG  TBPK Take as directed 2 x a day 09/26/21   Eustace Moore, MD    Family History Family History  Problem Relation Age of Onset   COPD Mother    Cancer Father     Social History Social History   Tobacco Use   Smoking status: Former    Current packs/day: 0.00    Types: Cigarettes    Quit date: 09/11/2017    Years since quitting: 5.6   Smokeless tobacco: Never  Vaping Use   Vaping status: Every Day   Substances: Nicotine, Flavoring  Substance Use Topics   Alcohol use: Yes    Comment: occasionally   Drug use: Not Currently     Allergies   Penicillins, Ibuprofen, and Sulfonamide derivatives   Review of Systems Review of Systems See HPI  Physical Exam Triage Vital Signs ED Triage Vitals  Encounter  Vitals Group     BP 05/11/23 1503 107/78     Systolic BP Percentile --      Diastolic BP Percentile --      Pulse Rate 05/11/23 1503 67     Resp 05/11/23 1503 17     Temp 05/11/23 1503 98.6 F (37 C)     Temp Source 05/11/23 1503 Oral     SpO2 05/11/23 1503 100 %     Weight 05/11/23 1501 200 lb (90.7 kg)     Height 05/11/23 1501 5\' 8"  (1.727 m)     Head Circumference --      Peak Flow --      Pain Score 05/11/23 1500 2     Pain Loc --      Pain Education --      Exclude from Growth Chart --    No data found.  Updated Vital Signs BP 107/78 (BP Location: Left Arm)   Pulse 67   Temp 98.6 F (37 C) (Oral)   Resp 17   Ht 5\' 8"  (1.727 m)   Wt 90.7 kg   SpO2 100%   BMI 30.41 kg/m       Physical Exam Constitutional:  General: He is not in acute distress.    Appearance: He is well-developed.  HENT:     Head: Normocephalic and atraumatic.     Mouth/Throat:      Comments: Periodontal disease, plaque, receding gums, swollen red gums Eyes:     Conjunctiva/sclera: Conjunctivae normal.     Pupils: Pupils are equal, round, and reactive to light.  Cardiovascular:     Rate and Rhythm: Normal rate.  Pulmonary:     Effort: Pulmonary effort is normal. No respiratory distress.  Abdominal:     General: There is no distension.     Palpations: Abdomen is soft.  Musculoskeletal:        General: Normal range of motion.     Cervical back: Normal range of motion.  Skin:    General: Skin is warm and dry.  Neurological:     Mental Status: He is alert.      UC Treatments / Results  Labs (all labs ordered are listed, but only abnormal results are displayed) Labs Reviewed - No data to display  EKG   Radiology No results found.  Procedures Procedures (including critical care time)  Medications Ordered in UC Medications - No data to display  Initial Impression / Assessment and Plan / UC Course  I have reviewed the triage vital signs and the nursing  notes.  Pertinent labs & imaging results that were available during my care of the patient were reviewed by me and considered in my medical decision making (see chart for details).     Final Clinical Impressions(s) / UC Diagnoses   Final diagnoses:  Dental caries  Dental infection     Discharge Instructions      Take cefdinir antibiotic 2 times a day Take over-the-counter medicine as needed for pain Call your dentist Monday to make an appointment for follow-up   ED Prescriptions     Medication Sig Dispense Auth. Provider   cefdinir (OMNICEF) 300 MG capsule Take 1 capsule (300 mg total) by mouth 2 (two) times daily. 20 capsule Eustace Moore, MD      I have reviewed the PDMP during this encounter.   Eustace Moore, MD 05/11/23 [phone]

## 2023-05-29 ENCOUNTER — Ambulatory Visit
Admission: EM | Admit: 2023-05-29 | Discharge: 2023-05-29 | Disposition: A | Discharge status: Home / Self Care | Payer: BC Managed Care – PPO | Attending: Internal Medicine | Admitting: Internal Medicine

## 2023-05-29 ENCOUNTER — Other Ambulatory Visit: Payer: Self-pay

## 2023-05-29 DIAGNOSIS — K029 Dental caries, unspecified: Secondary | ICD-10-CM | POA: Diagnosis not present

## 2023-05-29 DIAGNOSIS — K047 Periapical abscess without sinus: Secondary | ICD-10-CM

## 2023-05-29 MED ORDER — CLINDAMYCIN HCL 300 MG PO CAPS: 300.0000 mg | ORAL_CAPSULE | Freq: Three times a day (TID) | ORAL | 0 refills | Status: AC

## 2023-05-29 NOTE — ED Provider Notes (Signed)
[facility]    CSN: [id number] Arrival date & time: 05/29/23  1015      History   Chief Complaint Chief Complaint  Patient presents with   Dental Pain    HPI Michael Anthony is a 52 y.o. male.   Michael Anthony is a 52 y.o. male presenting for chief complaint of dental pain to the right lower mouth that started yesterday. He started chewing/eating light savers candies after he quit smoking 6 years ago and has had many cavities/dental decay ever since. Pain is localized to the right lower jaw and is currently 5/10. Denies sore throat, recent trauma/injury to the mouth, recent dental work, redness/swelling to the overlying skin of the mouth, ear pain, shortness of breath, trismus, speech changes, fever/chills, rash. Recently treated for dental infection of the left side of the mouth with cefdinir on 05/11/2023, took all of antibiotic as prescribed.  Using naproxen as needed for pain with some relief. He has an appointment to have affected tooth pulled next week at regular dentist.   Dental Pain   No past medical history on file.  Patient Active Problem List   Diagnosis Date Noted   LATERAL EPICONDYLITIS, RIGHT 07/29/2009   PARESTHESIA, HANDS 07/29/2009    Past Surgical History:  Procedure Laterality Date   BACK SURGERY     hernia         Home Medications    Prior to Admission medications   Medication Sig Start Date End Date Taking? Authorizing Provider  clindamycin (CLEOCIN) 300 MG capsule Take 1 capsule (300 mg total) by mouth 3 (three) times daily for 7 days. 05/29/23 06/05/23 Yes Carlisle Beers, FNP  cefdinir (OMNICEF) 300 MG capsule Take 1 capsule (300 mg total) by mouth 2 (two) times daily. 05/11/23   Eustace Moore, MD  naproxen sodium (ALEVE) 220 MG tablet Take 220 mg by mouth 2 (two) times daily as needed.    [provider]  nirmatrelvir & ritonavir (PAXLOVID, 300/100,) 20 x 150 MG & 10 x 100MG  TBPK Take as directed 2 x a day 09/26/21    Eustace Moore, MD    Family History Family History  Problem Relation Age of Onset   COPD Mother    Cancer Father     Social History Social History   Tobacco Use   Smoking status: Former    Current packs/day: 0.00    Types: Cigarettes    Quit date: 09/11/2017    Years since quitting: 5.7   Smokeless tobacco: Never  Vaping Use   Vaping status: Every Day   Substances: Nicotine, Flavoring  Substance Use Topics   Alcohol use: Yes    Comment: occasionally   Drug use: Not Currently     Allergies   Penicillins, Ibuprofen, and Sulfonamide derivatives   Review of Systems Review of Systems Per HPI  Physical Exam Triage Vital Signs ED Triage Vitals  Encounter Vitals Group     BP 05/29/23 1020 123/88     Systolic BP Percentile --      Diastolic BP Percentile --      Pulse Rate 05/29/23 1020 83     Resp 05/29/23 1020 16     Temp 05/29/23 1020 97.8 F (36.6 C)     Temp Source 05/29/23 1020 Oral     SpO2 05/29/23 1020 99 %     Weight --      Height --      Head Circumference --  Peak Flow --      Pain Score 05/29/23 1021 5     Pain Loc --      Pain Education --      Exclude from Growth Chart --    No data found.  Updated Vital Signs BP 123/88 (BP Location: Left Arm)   Pulse 83   Temp 97.8 F (36.6 C) (Oral)   Resp 16   SpO2 99%   Visual Acuity Right Eye Distance:   Left Eye Distance:   Bilateral Distance:    Right Eye Near:   Left Eye Near:    Bilateral Near:     Physical Exam Vitals and nursing note reviewed.  Constitutional:      Appearance: He is not ill-appearing or toxic-appearing.  HENT:     Head: Normocephalic and atraumatic.     Right Ear: Hearing, tympanic membrane, ear canal and external ear normal.     Left Ear: Hearing, tympanic membrane, ear canal and external ear normal.     Nose: Nose normal.     Mouth/Throat:     Lips: Pink.     Mouth: Mucous membranes are moist. No injury.     Dentition: Abnormal dentition. Does not  have dentures. Dental tenderness and dental caries present. No gingival swelling, dental abscesses or gum lesions.     Tongue: No lesions. Tongue does not deviate from midline.     Palate: No mass and lesions.     Pharynx: Oropharynx is clear. Uvula midline. No pharyngeal swelling, oropharyngeal exudate, posterior oropharyngeal erythema or uvula swelling.     Tonsils: No tonsillar exudate or tonsillar abscesses.      Comments: No appreciable dental abscess. No trismus, phonation normal. Infected dental carie as indicated above.  Eyes:     General: Lids are normal. Vision grossly intact. Gaze aligned appropriately.     Extraocular Movements: Extraocular movements intact.     Conjunctiva/sclera: Conjunctivae normal.  Cardiovascular:     Rate and Rhythm: Normal rate and regular rhythm.     Heart sounds: Normal heart sounds, S1 normal and S2 normal.  Pulmonary:     Effort: Pulmonary effort is normal. No respiratory distress.     Breath sounds: Normal breath sounds and air entry.  Musculoskeletal:     Cervical back: Neck supple.  Lymphadenopathy:     Cervical: No cervical adenopathy.  Skin:    General: Skin is warm and dry.     Capillary Refill: Capillary refill takes less than 2 seconds.     Findings: No rash.  Neurological:     General: No focal deficit present.     Mental Status: He is alert and oriented to person, place, and time. Mental status is at baseline.     Cranial Nerves: No dysarthria or facial asymmetry.  Psychiatric:        Mood and Affect: Mood normal.        Speech: Speech normal.        Behavior: Behavior normal.        Thought Content: Thought content normal.        Judgment: Judgment normal.      UC Treatments / Results  Labs (all labs ordered are listed, but only abnormal results are displayed) Labs Reviewed - No data to display  EKG   Radiology No results found.  Procedures Procedures (including critical care time)  Medications Ordered in  UC Medications - No data to display  Initial Impression / Assessment and Plan /  UC Course  I have reviewed the triage vital signs and the nursing notes.  Pertinent labs & imaging results that were available during my care of the patient were reviewed by me and considered in my medical decision making (see chart for details).   1. Pain due to dental caries, dental infection Evaluation suggests dental pain secondary to dental infection.  HEENT exam stable and without red flag signs indicating need for advanced imaging/further emergent workup. Will manage this with clindamycin antibiotic and over the counter medications as needed for pain and inflammation/swelling.  Patient is afebrile, nontoxic in appearance, and with hemodynamically stable vital signs.  Encouraged to follow-up with dentist for further management.   Counseled patient on potential for adverse effects with medications prescribed/recommended today, strict ER and return-to-clinic precautions discussed, patient verbalized understanding.    Final Clinical Impressions(s) / UC Diagnoses   Final diagnoses:  Pain due to dental caries  Dental infection     Discharge Instructions      Your dental pain is likely due to dental infection. Take  antibiotic as prescribed for the next 7 days to treat your dental infection. Continue use of ibuprofen as needed with food for dental inflammation and pain.   You may also use tylenol as needed for pain. Perform salt water gargles every 3-4 hours.  If you develop any new or worsening symptoms or if your symptoms do not start to improve, pleases return here or follow-up with your primary care provider. If your symptoms are severe, please go to the emergency room.     ED Prescriptions     Medication Sig Dispense Auth. Provider   clindamycin (CLEOCIN) 300 MG capsule Take 1 capsule (300 mg total) by mouth 3 (three) times daily for 7 days. 21 capsule Carlisle Beers, FNP       PDMP not reviewed this encounter.   Carlisle Beers, [place] 05/29/23 1103

## 2023-05-29 NOTE — ED Triage Notes (Signed)
Right lower dental pain since yesterday.

## 2023-05-29 NOTE — Discharge Instructions (Signed)
Your dental pain is likely due to dental infection. Take  antibiotic as prescribed for the next 7 days to treat your dental infection. Continue use of ibuprofen as needed with food for dental inflammation and pain.   You may also use tylenol as needed for pain. Perform salt water gargles every 3-4 hours.  If you develop any new or worsening symptoms or if your symptoms do not start to improve, pleases return here or follow-up with your primary care provider. If your symptoms are severe, please go to the emergency room.

## 2023-05-31 ENCOUNTER — Ambulatory Visit: Payer: BC Managed Care – PPO

## 2024-10-05 ENCOUNTER — Encounter: Payer: Self-pay | Admitting: Emergency Medicine

## 2024-10-05 ENCOUNTER — Ambulatory Visit

## 2024-10-05 ENCOUNTER — Ambulatory Visit
Admission: EM | Admit: 2024-10-05 | Discharge: 2024-10-05 | Disposition: A | Discharge status: Home / Self Care | Attending: Internal Medicine | Admitting: Internal Medicine

## 2024-10-05 DIAGNOSIS — R0981 Nasal congestion: Secondary | ICD-10-CM | POA: Diagnosis not present

## 2024-10-05 DIAGNOSIS — R051 Acute cough: Secondary | ICD-10-CM

## 2024-10-05 DIAGNOSIS — J0100 Acute maxillary sinusitis, unspecified: Secondary | ICD-10-CM

## 2024-10-05 DIAGNOSIS — J209 Acute bronchitis, unspecified: Secondary | ICD-10-CM

## 2024-10-05 MED ORDER — PROMETHAZINE-DM 6.25-15 MG/5ML PO SYRP: 5.0000 mL | ORAL_SOLUTION | Freq: Three times a day (TID) | ORAL | 0 refills | Status: AC | PRN

## 2024-10-05 MED ORDER — PREDNISONE 20 MG PO TABS: 40.0000 mg | ORAL_TABLET | Freq: Every day | ORAL | 0 refills | Status: AC

## 2024-10-05 MED ORDER — ALBUTEROL SULFATE HFA 108 (90 BASE) MCG/ACT IN AERS: 1.0000 | INHALATION_SPRAY | Freq: Four times a day (QID) | RESPIRATORY_TRACT | 0 refills | Status: AC | PRN

## 2024-10-05 MED ORDER — AZITHROMYCIN 250 MG PO TABS: ORAL_TABLET | ORAL | 0 refills | Status: AC

## 2024-10-05 NOTE — ED Triage Notes (Signed)
 Patient c/o possible sinus infection x 1 week, chest congestion, nasal drainage.  Patient did have influenza last week, fever went away on Wednesday, continued with sinus problems.  Taken Dayquil, Nyquil, Aleve and Mucinex.

## 2024-10-05 NOTE — ED Provider Notes (Addendum)
 " [facility]    CSN: [id number] Arrival date & time: 10/05/24  1051      History   Chief Complaint Chief Complaint  Patient presents with   Sinus Infection    HPI Michael Anthony is a 54 y.o. male.   54 year old male who presents urgent care with complaints of sinus pressure, sinus pain and sinus congestion.  He also has some chest congestion.  He had influenza about a week ago and his fever and other flulike symptoms resolved however he continues to have a significant amount of congestion.  His symptoms are now going on just over a week.  He has tried DayQuil, NyQuil, Aleve and Mucinex.  He is also still having a lot of coughing and at times this cough does seem to be productive.  He reports that when he blows his nose he is getting a lot of green and thick mucus.  He denies any nausea, vomiting, shortness of breath, diarrhea.  He is eating and drinking okay.     History reviewed. No pertinent past medical history.  Patient Active Problem List   Diagnosis Date Noted   Lateral epicondylitis 07/29/2009   PARESTHESIA, HANDS 07/29/2009    Past Surgical History:  Procedure Laterality Date   BACK SURGERY     hernia         Home Medications    Prior to Admission medications  Medication Sig Start Date End Date Taking? Authorizing Provider  albuterol  (VENTOLIN  HFA) 108 (90 Base) MCG/ACT inhaler Inhale 1-2 puffs into the lungs every 6 (six) hours as needed for wheezing or shortness of breath. 10/05/24  Yes Quetzali Heinle A, PA-C  azithromycin  (ZITHROMAX ) 250 MG tablet Take first 2 tablets together, then 1 every day until finished. 10/05/24  Yes Cherylee Rawlinson A, PA-C  naproxen sodium (ALEVE) 220 MG tablet Take 220 mg by mouth 2 (two) times daily as needed.   Yes [provider]  predniSONE  (DELTASONE ) 20 MG tablet Take 2 tablets (40 mg total) by mouth daily with breakfast for 5 days. 10/05/24 10/10/24 Yes Marcha Licklider A, PA-C   promethazine -dextromethorphan (PROMETHAZINE -DM) 6.25-15 MG/5ML syrup Take 5 mLs by mouth every 8 (eight) hours as needed for cough. 10/05/24  Yes Dontasia Miranda A, PA-C  cefdinir  (OMNICEF ) 300 MG capsule Take 1 capsule (300 mg total) by mouth 2 (two) times daily. 05/11/23   Maranda Jamee Jacob, MD  nirmatrelvir & ritonavir  (PAXLOVID , 300/100,) 20 x 150 MG & 10 x 100MG  TBPK Take as directed 2 x a day 09/26/21   Maranda Jamee Jacob, MD    Family History Family History  Problem Relation Age of Onset   COPD Mother    Cancer Father     Social History Social History[1]   Allergies   Penicillins, Ibuprofen, and Sulfonamide derivatives   Review of Systems Review of Systems  Constitutional:  Negative for chills and fever.  HENT:  Positive for congestion, sinus pressure and sinus pain. Negative for ear pain and sore throat.   Eyes:  Negative for pain and visual disturbance.  Respiratory:  Positive for cough. Negative for shortness of breath.   Cardiovascular:  Negative for chest pain and palpitations.  Gastrointestinal:  Negative for abdominal pain and vomiting.  Genitourinary:  Negative for dysuria and hematuria.  Musculoskeletal:  Negative for arthralgias and back pain.  Skin:  Negative for color change and rash.  Neurological:  Negative for seizures and syncope.  All other systems reviewed and are negative.  Physical Exam Triage Vital Signs ED Triage Vitals [10/05/24 1103]  Encounter Vitals Group     BP      Girls Systolic BP Percentile      Girls Diastolic BP Percentile      Boys Systolic BP Percentile      Boys Diastolic BP Percentile      Pulse      Resp      Temp      Temp src      SpO2      Weight 190 lb (86.2 kg)     Height 5' 7 (1.702 m)     Head Circumference      Peak Flow      Pain Score 2     Pain Loc      Pain Education      Exclude from Growth Chart    No data found.  Updated Vital Signs BP 111/75 (BP Location: Right Arm)   Pulse 74   Temp 99.4  F (37.4 C) (Oral)   Resp 18   Ht 5' 7 (1.702 m)   Wt 190 lb (86.2 kg)   SpO2 94%   BMI 29.76 kg/m   Visual Acuity Right Eye Distance:   Left Eye Distance:   Bilateral Distance:    Right Eye Near:   Left Eye Near:    Bilateral Near:     Physical Exam Vitals and nursing note reviewed.  Constitutional:      General: He is not in acute distress.    Appearance: He is well-developed.  HENT:     Head: Normocephalic and atraumatic.     Nose: Congestion and rhinorrhea present.     Mouth/Throat:     Mouth: Mucous membranes are moist.     Pharynx: No posterior oropharyngeal erythema.  Eyes:     Conjunctiva/sclera: Conjunctivae normal.  Cardiovascular:     Rate and Rhythm: Normal rate and regular rhythm.     Heart sounds: No murmur heard. Pulmonary:     Effort: Pulmonary effort is normal. No respiratory distress.     Breath sounds: Examination of the right-upper field reveals rhonchi. Examination of the left-upper field reveals rhonchi. Examination of the right-middle field reveals rhonchi. Examination of the left-middle field reveals rhonchi. Examination of the right-lower field reveals rhonchi. Examination of the left-lower field reveals rhonchi. Rhonchi present. No decreased breath sounds or wheezing.  Abdominal:     Palpations: Abdomen is soft.     Tenderness: There is no abdominal tenderness.  Musculoskeletal:        General: No swelling.     Cervical back: Neck supple.  Skin:    General: Skin is warm and dry.     Capillary Refill: Capillary refill takes less than 2 seconds.  Neurological:     Mental Status: He is alert.  Psychiatric:        Mood and Affect: Mood normal.      UC Treatments / Results  Labs (all labs ordered are listed, but only abnormal results are displayed) Labs Reviewed - No data to display  EKG   Radiology DG Chest 2 View Result Date: 10/05/2024 EXAM: 2 VIEW(S) XRAY OF THE CHEST 10/05/2024 11:26:00 AM COMPARISON: None available. CLINICAL  HISTORY: Acute cough, flu last week. FINDINGS: LUNGS AND PLEURA: No focal pulmonary opacity. No pleural effusion. No pneumothorax. HEART AND MEDIASTINUM: No acute abnormality of the cardiac and mediastinal silhouettes. BONES AND SOFT TISSUES: No acute osseous abnormality. IMPRESSION: 1. No acute process.  Electronically signed by: Waddell Calk MD 10/05/2024 11:50 AM EST RP Workstation: [id number]    Procedures Procedures (including critical care time)  Medications Ordered in UC Medications - No data to display  Initial Impression / Assessment and Plan / UC Course  I have reviewed the triage vital signs and the nursing notes.  Pertinent labs & imaging results that were available during my care of the patient were reviewed by me and considered in my medical decision making (see chart for details).     Acute non-recurrent maxillary sinusitis  Sinus congestion  Acute cough - Plan: DG Chest 2 View, DG Chest 2 View  Acute bronchitis, unspecified organism   Chest x-ray done today.  Final evaluation by the radiologist does not show any acute findings.  Symptoms and physical exam findings are most consistent with mild bronchitis following influenza as well as an acute maxillary sinusitis.  We will treat this with antibiotics by mouth, steroids and inhaler.  We recommend the following: Azithromycin  250mg  Take 2 tablets today and the 1 tablet daily for 4 more days. Prednisone  40 mg (2 tablets) once daily for 5 days. Take this in the morning.  This is a steroid to help with inflammation Promethazine  DM 5 mL every 8 hours as needed for cough.  Use caution as this medication can cause drowsiness. Albuterol  inhaler 1-2 puffs every 6 hours as needed for wheezing/shortness of breath. Make sure to stay hydrated by drinking plenty of water. Return to urgent care or PCP if symptoms worsen or fail to resolve.    Final Clinical Impressions(s) / UC Diagnoses   Final diagnoses:  Sinus congestion  Acute  cough  Acute non-recurrent maxillary sinusitis  Acute bronchitis, unspecified organism     Discharge Instructions      Chest x-ray done today.  Final evaluation by the radiologist does not show any acute findings.  Symptoms and physical exam findings are most consistent with mild bronchitis following influenza as well as an acute maxillary sinusitis.  We will treat this with antibiotics by mouth, steroids and inhaler.  We recommend the following: Azithromycin  250mg  Take 2 tablets today and the 1 tablet daily for 4 more days. Prednisone  40 mg (2 tablets) once daily for 5 days. Take this in the morning.  This is a steroid to help with inflammation Promethazine  DM 5 mL every 8 hours as needed for cough.  Use caution as this medication can cause drowsiness. Albuterol  inhaler 1-2 puffs every 6 hours as needed for wheezing/shortness of breath. Make sure to stay hydrated by drinking plenty of water. Return to urgent care or PCP if symptoms worsen or fail to resolve.       ED Prescriptions     Medication Sig Dispense Auth. Provider   azithromycin  (ZITHROMAX ) 250 MG tablet Take first 2 tablets together, then 1 every day until finished. 6 tablet Lacreasha Hinds A, PA-C   predniSONE  (DELTASONE ) 20 MG tablet Take 2 tablets (40 mg total) by mouth daily with breakfast for 5 days. 10 tablet Genie Wenke A, PA-C   promethazine -dextromethorphan (PROMETHAZINE -DM) 6.25-15 MG/5ML syrup Take 5 mLs by mouth every 8 (eight) hours as needed for cough. 180 mL Mckenzi Buonomo A, PA-C   albuterol  (VENTOLIN  HFA) 108 (90 Base) MCG/ACT inhaler Inhale 1-2 puffs into the lungs every 6 (six) hours as needed for wheezing or shortness of breath. 6.7 g Teresa Almarie LABOR, [place]      PDMP not reviewed this encounter.    Teresa Almarie LABOR, PA-C  10/05/24 1204     [1]  Social History Tobacco Use   Smoking status: Former    Current packs/day: 0.00    Types: Cigarettes    Quit date: 09/11/2017    Years since  quitting: 7.0   Smokeless tobacco: Never  Vaping Use   Vaping status: Every Day   Substances: Nicotine, Flavoring  Substance Use Topics   Alcohol use: Yes    Comment: occasionally   Drug use: Not Currently     Teresa Almarie LABOR, PA-C 10/05/24 1204  "

## 2024-10-05 NOTE — Discharge Instructions (Addendum)
 Chest x-ray done today.  Final evaluation by the radiologist does not show any acute findings.  Symptoms and physical exam findings are most consistent with mild bronchitis following influenza as well as an acute maxillary sinusitis.  We will treat this with antibiotics by mouth, steroids and inhaler.  We recommend the following: Azithromycin  250mg  Take 2 tablets today and the 1 tablet daily for 4 more days. Prednisone  40 mg (2 tablets) once daily for 5 days. Take this in the morning.  This is a steroid to help with inflammation Promethazine  DM 5 mL every 8 hours as needed for cough.  Use caution as this medication can cause drowsiness. Albuterol  inhaler 1-2 puffs every 6 hours as needed for wheezing/shortness of breath. Make sure to stay hydrated by drinking plenty of water. Return to urgent care or PCP if symptoms worsen or fail to resolve.
